# Patient Record
Sex: Female | Born: 1960
Health system: Southern US, Community
[De-identification: ages and names within clinical notes are randomized; demographics above are authoritative.]

## PROBLEM LIST (undated history)

## (undated) DIAGNOSIS — F419 Anxiety disorder, unspecified: Secondary | ICD-10-CM

## (undated) DIAGNOSIS — I8390 Asymptomatic varicose veins of unspecified lower extremity: Secondary | ICD-10-CM

## (undated) DIAGNOSIS — I10 Essential (primary) hypertension: Secondary | ICD-10-CM

## (undated) HISTORY — PX: TUBAL LIGATION: SHX77

## (undated) HISTORY — DX: Asymptomatic varicose veins of unspecified lower extremity: I83.90

## (undated) HISTORY — DX: Anxiety disorder, unspecified: F41.9

## (undated) HISTORY — DX: Essential (primary) hypertension: I10

---

## 2003-01-20 ENCOUNTER — Ambulatory Visit (HOSPITAL_COMMUNITY): Admission: RE | Admit: 2003-01-20 | Discharge: 2003-01-20 | Payer: Self-pay | Admitting: Family Medicine

## 2004-02-23 ENCOUNTER — Ambulatory Visit (HOSPITAL_COMMUNITY): Admission: RE | Admit: 2004-02-23 | Discharge: 2004-02-23 | Payer: Self-pay | Admitting: Family Medicine

## 2005-05-02 ENCOUNTER — Encounter: Admission: RE | Admit: 2005-05-02 | Discharge: 2005-05-02 | Payer: Self-pay | Admitting: Family Medicine

## 2006-08-06 ENCOUNTER — Encounter: Admission: RE | Admit: 2006-08-06 | Discharge: 2006-08-06 | Payer: Self-pay | Admitting: Family Medicine

## 2007-09-23 ENCOUNTER — Ambulatory Visit (HOSPITAL_COMMUNITY): Admission: RE | Admit: 2007-09-23 | Discharge: 2007-09-23 | Payer: Self-pay | Admitting: Neurology

## 2009-12-05 ENCOUNTER — Ambulatory Visit (HOSPITAL_COMMUNITY): Admission: RE | Admit: 2009-12-05 | Discharge: 2009-12-05 | Payer: Self-pay | Admitting: Neurology

## 2010-04-15 ENCOUNTER — Encounter: Payer: Self-pay | Admitting: Family Medicine

## 2011-12-12 ENCOUNTER — Other Ambulatory Visit (HOSPITAL_BASED_OUTPATIENT_CLINIC_OR_DEPARTMENT_OTHER): Payer: Self-pay | Admitting: Family Medicine

## 2011-12-12 DIAGNOSIS — M545 Low back pain, unspecified: Secondary | ICD-10-CM

## 2011-12-17 ENCOUNTER — Ambulatory Visit (HOSPITAL_BASED_OUTPATIENT_CLINIC_OR_DEPARTMENT_OTHER)
Admission: RE | Admit: 2011-12-17 | Discharge: 2011-12-17 | Disposition: A | Discharge status: Home / Self Care | Payer: BC Managed Care – PPO | Source: Ambulatory Visit | Attending: Family Medicine | Admitting: Family Medicine

## 2011-12-17 DIAGNOSIS — M5126 Other intervertebral disc displacement, lumbar region: Secondary | ICD-10-CM | POA: Insufficient documentation

## 2011-12-17 DIAGNOSIS — M545 Low back pain, unspecified: Secondary | ICD-10-CM | POA: Insufficient documentation

## 2011-12-17 DIAGNOSIS — R209 Unspecified disturbances of skin sensation: Secondary | ICD-10-CM | POA: Insufficient documentation

## 2012-07-02 ENCOUNTER — Other Ambulatory Visit (HOSPITAL_COMMUNITY): Payer: Self-pay | Admitting: Neurology

## 2012-07-02 DIAGNOSIS — Z1231 Encounter for screening mammogram for malignant neoplasm of breast: Secondary | ICD-10-CM

## 2012-07-14 ENCOUNTER — Ambulatory Visit (HOSPITAL_COMMUNITY)
Admission: RE | Admit: 2012-07-14 | Discharge: 2012-07-14 | Disposition: A | Discharge status: Home / Self Care | Payer: BC Managed Care – PPO | Source: Ambulatory Visit | Attending: Neurology | Admitting: Neurology

## 2012-07-14 DIAGNOSIS — Z1231 Encounter for screening mammogram for malignant neoplasm of breast: Secondary | ICD-10-CM | POA: Insufficient documentation

## 2012-07-15 ENCOUNTER — Other Ambulatory Visit: Payer: Self-pay | Admitting: Neurology

## 2012-07-15 DIAGNOSIS — R928 Other abnormal and inconclusive findings on diagnostic imaging of breast: Secondary | ICD-10-CM

## 2012-07-16 ENCOUNTER — Ambulatory Visit
Admission: RE | Admit: 2012-07-16 | Discharge: 2012-07-16 | Disposition: A | Discharge status: Home / Self Care | Payer: BC Managed Care – PPO | Source: Ambulatory Visit | Attending: Neurology | Admitting: Neurology

## 2012-07-16 DIAGNOSIS — R928 Other abnormal and inconclusive findings on diagnostic imaging of breast: Secondary | ICD-10-CM

## 2012-07-20 ENCOUNTER — Other Ambulatory Visit: Payer: Self-pay | Admitting: *Deleted

## 2012-07-20 DIAGNOSIS — I83893 Varicose veins of bilateral lower extremities with other complications: Secondary | ICD-10-CM

## 2012-08-07 ENCOUNTER — Encounter: Payer: Self-pay | Admitting: Surgery

## 2012-08-10 ENCOUNTER — Encounter: Payer: BC Managed Care – PPO | Admitting: Vascular Surgery

## 2012-09-28 ENCOUNTER — Encounter: Payer: BC Managed Care – PPO | Admitting: Vascular Surgery

## 2012-10-02 ENCOUNTER — Encounter: Payer: BC Managed Care – PPO | Admitting: Vascular Surgery

## 2012-10-05 ENCOUNTER — Encounter: Payer: BC Managed Care – PPO | Admitting: Vascular Surgery

## 2013-04-14 ENCOUNTER — Other Ambulatory Visit: Payer: Self-pay | Admitting: *Deleted

## 2013-04-14 ENCOUNTER — Encounter: Payer: Self-pay | Admitting: Surgery

## 2013-04-14 DIAGNOSIS — I83893 Varicose veins of bilateral lower extremities with other complications: Secondary | ICD-10-CM

## 2013-06-02 ENCOUNTER — Encounter: Payer: Self-pay | Admitting: Podiatry

## 2013-06-02 ENCOUNTER — Ambulatory Visit (INDEPENDENT_AMBULATORY_CARE_PROVIDER_SITE_OTHER): Payer: BC Managed Care – PPO | Admitting: Podiatry

## 2013-06-02 ENCOUNTER — Ambulatory Visit (INDEPENDENT_AMBULATORY_CARE_PROVIDER_SITE_OTHER): Payer: BC Managed Care – PPO

## 2013-06-02 VITALS — BP 116/68 | HR 74 | Resp 12

## 2013-06-02 DIAGNOSIS — M2010 Hallux valgus (acquired), unspecified foot: Secondary | ICD-10-CM

## 2013-06-02 DIAGNOSIS — R52 Pain, unspecified: Secondary | ICD-10-CM

## 2013-06-02 DIAGNOSIS — M779 Enthesopathy, unspecified: Secondary | ICD-10-CM

## 2013-06-02 NOTE — Progress Notes (Signed)
   Subjective:    Patient ID: Taylor ShoalsLisa A Ourada, female    DOB: March 13, 1961, 53 y.o.   MRN: [id number]  HPI PT STATED BOTH FEET BEEN HURTING 20 YEARS. THE END OF THE DAY THE FEET ARE WORSE, ESPECIALLY WHEN WALKING. TRIED TO WEAR OTC INSERTS AND SEE BIG DIFFERENCE.    Review of Systems  Constitutional: Positive for fatigue.  Cardiovascular: Positive for leg swelling.  Musculoskeletal: Positive for back pain and joint swelling.  Neurological: Positive for numbness.  Hematological: Bruises/bleeds easily.       Objective:   Physical Exam        Assessment & Plan:

## 2013-06-03 NOTE — Progress Notes (Signed)
Subjective:     Patient ID: Taylor Castillo, female   DOB: 1960-11-08, 53 y.o.   MRN: [id number]  HPI patient presents with severe pain in both feet stating she's been on cement floors and that are arches heels and forefoot get tender on both feet   Review of Systems  All other systems reviewed and are negative.       Objective:   Physical Exam  Nursing note and vitals reviewed. Constitutional: She is oriented to person, place, and time.  Cardiovascular: Intact distal pulses.   Musculoskeletal: Normal range of motion.  Neurological: She is oriented to person, place, and time.  Skin: Skin is warm.   neurovascular status is intact with discomfort in the heels and arches of both feet of a moderate nature. Mild structural bunion deformity noted both feet and I noted range of motion and muscle strength to be adequate. Orthopedic shows that the joint motion is normal and the arch height is within normal limits     Assessment:     Chronic fasciitis and foot pain secondary to foot structure and moderate HAV deformity of both feet    Plan:     Reviewed both conditions and x-rays. Scanned for custom orthotics to reduce stress on feet and advised on wider type of steel toe shoes. Reappoint 2 weeks

## 2013-06-17 ENCOUNTER — Ambulatory Visit (INDEPENDENT_AMBULATORY_CARE_PROVIDER_SITE_OTHER): Payer: BC Managed Care – PPO | Admitting: Podiatry

## 2013-06-17 DIAGNOSIS — M722 Plantar fascial fibromatosis: Secondary | ICD-10-CM

## 2013-06-18 NOTE — Progress Notes (Signed)
Subjective:     Patient ID: Taylor ShoalsLisa A Castillo, female   DOB: March 03, 1961, 53 y.o.   MRN: [id number]  HPI patient presents stating I am still having discomfort but hopeful for the orthotics to be of benefit   Review of Systems     Objective:   Physical Exam Neurovascular status unchanged with discomfort still noted upon deep palpation    Assessment:     Continue plantar fasciitis tendinitis-like condition    Plan:     Dispensed orthotics with instructions on usage and reviewed physical therapy and shoe gear modification

## 2013-07-16 ENCOUNTER — Encounter: Payer: Self-pay | Admitting: Surgery

## 2013-07-19 ENCOUNTER — Encounter: Payer: Self-pay | Admitting: Surgery

## 2013-07-19 ENCOUNTER — Ambulatory Visit (INDEPENDENT_AMBULATORY_CARE_PROVIDER_SITE_OTHER): Payer: BC Managed Care – PPO | Admitting: Surgery

## 2013-07-19 ENCOUNTER — Ambulatory Visit (HOSPITAL_COMMUNITY)
Admission: RE | Admit: 2013-07-19 | Discharge: 2013-07-19 | Disposition: A | Discharge status: Home / Self Care | Payer: BC Managed Care – PPO | Source: Ambulatory Visit | Attending: Surgery | Admitting: Surgery

## 2013-07-19 VITALS — BP 121/77 | HR 65 | Ht 67.0 in | Wt 176.0 lb

## 2013-07-19 DIAGNOSIS — I83893 Varicose veins of bilateral lower extremities with other complications: Secondary | ICD-10-CM

## 2013-07-19 NOTE — Progress Notes (Signed)
Patient name: Taylor ShoalsLisa A Connelly MRN: [id number] DOB: Jan 03, 1961 Sex: female   Referred by: Dr. Foy GuadalajaraFried  Reason for referral:  Chief Complaint  Patient presents with  . Varicose Veins    painful bilateral vv's L>R    HISTORY OF PRESENT ILLNESS: This is a 53 year old female who is referred today for evaluation of lower extremity varicose veins.  The patient states that she has been having symptoms in her left leg for many many years.  These have gotten progressively worse.  They are now interfering with her work.  She describes burning and pain around her varicosities.  She also complains of leg swelling which is worse at the end of the day.  She has tried to wear compression in the past but has had to stop secondary to numbness.  She has not had any bleeding episodes.  She denies a history of DVT or PE.  The patient is medically managed for hypertension and anxiety.  She is a nonsmoker.  Past Medical History  Diagnosis Date  . Anxiety   . Hypertension     Past Surgical History  Procedure Laterality Date  . Tubal ligation Bilateral     History   Social History  . Marital Status: Married    Spouse Name: N/A    Number of Children: N/A  . Years of Education: N/A   Occupational History  . Not on file.   Social History Main Topics  . Smoking status: Former Smoker    Quit date: 07/19/2005  . Smokeless tobacco: Not on file  . Alcohol Use: 4.8 oz/week    8 Cans of beer per week  . Drug Use: No  . Sexual Activity: Not on file   Other Topics Concern  . Not on file   Social History Narrative  . No narrative on file    Family History  Problem Relation Age of Onset  . Hypertension Mother   . Asthma Mother   . Hypertension Father   . Asthma Brother   . Alcohol abuse Paternal Grandmother   . Heart disease Paternal Grandmother   . Alcohol abuse Paternal Grandfather     Allergies as of 07/19/2013  . (No Known Allergies)    Current Outpatient Prescriptions on File  Prior to Visit  Medication Sig Dispense Refill  . ALPRAZolam (XANAX) 1 MG tablet Take 1 mg by mouth 2 (two) times daily as needed for anxiety.      . hydrochlorothiazide (MICROZIDE) 12.5 MG capsule Take 12.5 mg by mouth daily.       No current facility-administered medications on file prior to visit.     REVIEW OF SYSTEMS: Cardiovascular: No chest pain, chest pressure, palpitations, orthopnea, or dyspnea on exertion. No claudication or rest pain,  positive for varicosities with pain and swelling Pulmonary: No productive cough, asthma or wheezing. Neurologic: No weakness, paresthesias, aphasia, or amaurosis. No dizziness. Hematologic: No bleeding problems or clotting disorders. Musculoskeletal: No joint pain or joint swelling. Gastrointestinal: No blood in stool or hematemesis Genitourinary: No dysuria or hematuria. Psychiatric:: No history of major depression. Integumentary: No rashes or ulcers. Constitutional: No fever or chills.  PHYSICAL EXAMINATION: General: The patient appears their stated age.  Vital signs are BP 121/77  Pulse 65  Ht 5\' 7"  (1.702 m)  Wt 176 lb (79.833 kg)  BMI 27.56 kg/m2  SpO2 100% HEENT:  No gross abnormalities Pulmonary: Respirations are non-labored Abdomen: Soft and non-tender  Musculoskeletal: There are no major deformities.  Neurologic: No focal weakness or paresthesias are detected, Skin: There are no ulcer or rashes noted. Psychiatric: The patient has normal affect. Cardiovascular: There is a regular rate and rhythm without significant murmur appreciated.  Palpable pedal pulses bilaterally.  Prominent varicosities on the posterior left calf extending up into the medial lower thigh.  1+ lower extremity edema on the left  Diagnostic Studies: I have reviewed her ultrasound study.  This shows no evidence of reflux in the right leg.  On the left there is deep vein reflux at the saphenofemoral junction.  There is no evidence of DVT.  There is reflux  within the left great saphenous vein with maximum diameter of 0.98 at the knee.  At the saphenofemoral junction, the diameter measured 0.9 cm.   Assessment:  Symptomatic venous insufficiency, left leg Plan: The patient is having significant lifestyle limitation from her venous insufficiency on the left leg.  She has very prominent varicosities which are very painful and tender.  She also has swelling and pain in her leg, particularly as the day progresses.  Ultrasound today shows deep system reflux as well as significant reflux within the great saphenous vein.  I have recommended a trial of her thigh high, 20-30 mmHg compression stockings.  The patient will return for further evaluation in 3 months.  She will be a good candidate for laser ablation of the left great saphenous vein as well as the stab phlebectomy of her varicosities.     Jorge NyV. Wells Miking Usrey IV, M.D. [facility] Office: [phone] Pager:  [phone]

## 2013-10-26 ENCOUNTER — Ambulatory Visit: Payer: BC Managed Care – PPO | Admitting: Vascular Surgery

## 2013-11-01 ENCOUNTER — Encounter: Payer: Self-pay | Admitting: Vascular Surgery

## 2013-11-02 ENCOUNTER — Ambulatory Visit: Payer: BC Managed Care – PPO | Admitting: Vascular Surgery

## 2013-11-03 ENCOUNTER — Ambulatory Visit
Admission: RE | Admit: 2013-11-03 | Discharge: 2013-11-03 | Disposition: A | Discharge status: Home / Self Care | Payer: BC Managed Care – PPO | Source: Ambulatory Visit | Attending: Cardiology | Admitting: Cardiology

## 2013-11-03 ENCOUNTER — Other Ambulatory Visit: Payer: Self-pay | Admitting: Cardiology

## 2013-11-03 DIAGNOSIS — R0789 Other chest pain: Secondary | ICD-10-CM

## 2013-11-12 ENCOUNTER — Encounter: Payer: Self-pay | Admitting: Vascular Surgery

## 2013-11-15 ENCOUNTER — Ambulatory Visit: Payer: BC Managed Care – PPO | Admitting: Vascular Surgery

## 2014-02-07 ENCOUNTER — Encounter: Payer: Self-pay | Admitting: Vascular Surgery

## 2014-02-08 ENCOUNTER — Ambulatory Visit (INDEPENDENT_AMBULATORY_CARE_PROVIDER_SITE_OTHER): Payer: BC Managed Care – PPO | Admitting: Vascular Surgery

## 2014-02-08 ENCOUNTER — Encounter: Payer: Self-pay | Admitting: Vascular Surgery

## 2014-02-08 VITALS — BP 129/82 | HR 76 | Resp 16 | Ht 68.0 in | Wt 178.0 lb

## 2014-02-08 DIAGNOSIS — I83892 Varicose veins of left lower extremities with other complications: Secondary | ICD-10-CM

## 2014-02-08 NOTE — Progress Notes (Signed)
Subjective:     Patient ID: Taylor Castillo, female   DOB: 02/07/1961, 53 y.o.   MRN: [id number]  HPI this 53 year old female returns for continued follow-up regarding her painful varicosities in the left leg. She works standing on her feet most of the day had [employer] [place]. She has pain which includes aching throbbing and burning discomfort as well as progressive swelling in the left ankle. This has not been relieved by long-leg elastic compression stockings 20-30 mm gradient or elevation and ibuprofen. This is affecting her daily living and ability to work.   Past Medical History  Diagnosis Date  . Anxiety   . Hypertension   . Varicose veins     History  Substance Use Topics  . Smoking status: Former Smoker    Quit date: 07/19/2005  . Smokeless tobacco: Not on file  . Alcohol Use: 4.8 oz/week    8 Cans of beer per week    Family History  Problem Relation Age of Onset  . Hypertension Mother   . Asthma Mother   . Hypertension Father   . Asthma Brother   . Alcohol abuse Paternal Grandmother   . Heart disease Paternal Grandmother   . Alcohol abuse Paternal Grandfather     No Known Allergies  Current outpatient prescriptions: ALPRAZolam (XANAX) 1 MG tablet, Take 1 mg by mouth 2 (two) times daily as needed for anxiety., Disp: , Rfl: ;  hydrochlorothiazide (MICROZIDE) 12.5 MG capsule, Take 12.5 mg by mouth daily., Disp: , Rfl:   BP 129/82 mmHg  Pulse 76  Resp 16  Ht 5\' 8"  (1.727 m)  Wt 178 lb (80.74 kg)  BMI 27.07 kg/m2  Body mass index is 27.07 kg/(m^2).          Review of Systems Denies chest pain, dyspnea on exertion, PND, orthopnea, hemoptysis, claudication.     Objective:   Physical Exam BP 129/82 mmHg  Pulse 76  Resp 16  Ht 5\' 8"  (1.727 m)  Wt 178 lb (80.74 kg)  BMI 27.07 kg/m2  .Gen. Well-developed well-nourished female in no apparent stress alert and oriented 3 Lungs no rhonchi or wheezing Left leg with large bulging  varicosities beginning in the medial thigh mid thigh area and extending medial to the knee and into the medial calf and posteriorly into the popliteal fossa. 1+ edema present. No hyperpigmentation or ulceration noted.  Patient has documented gross reflux throughout left great saphenous vein supplying these bulging varicosities with no DVT     Assessment:     Painful varicosities left leg due to gross reflux throughout left great saphenous vein. Symptoms are resistant to conservative measures and affecting patient's daily living and ability to work     Plan:     Patient needs laser ablation left great saphenous vein with greater than 20 stab phlebectomy of painful varicosities. Will proceed with pre-certification to perform this and hopefully relieve her symptoms in the near future

## 2014-03-01 ENCOUNTER — Other Ambulatory Visit: Payer: Self-pay | Admitting: *Deleted

## 2014-03-01 DIAGNOSIS — I83892 Varicose veins of left lower extremities with other complications: Secondary | ICD-10-CM

## 2014-03-11 ENCOUNTER — Encounter: Payer: Self-pay | Admitting: Vascular Surgery

## 2014-03-14 ENCOUNTER — Ambulatory Visit (INDEPENDENT_AMBULATORY_CARE_PROVIDER_SITE_OTHER): Payer: BC Managed Care – PPO | Admitting: Vascular Surgery

## 2014-03-14 ENCOUNTER — Encounter: Payer: Self-pay | Admitting: Vascular Surgery

## 2014-03-14 VITALS — BP 133/82 | HR 76 | Resp 16 | Ht 67.0 in | Wt 170.0 lb

## 2014-03-14 DIAGNOSIS — I83892 Varicose veins of left lower extremities with other complications: Secondary | ICD-10-CM

## 2014-03-14 NOTE — Progress Notes (Addendum)
     Laser Ablation Procedure    Date: 03/14/2014   Taylor ShoalsLisa A Hojnacki DOB:21-Aug-1960  Consent signed: Yes    Surgeon:  Dr. Hart RochesterLawson  Procedure: Laser Ablation: left Greater Saphenous Vein Start time: 9am, stop time: 10:30  BP 133/82 mmHg  Pulse 76  Resp 16  Ht 5\' 7"  (1.702 m)  Wt 170 lb (77.111 kg)  BMI 26.62 kg/m2  Tumescent Anesthesia: 500 cc 0.9% NaCl with 50 cc Lidocaine HCL with 1% Epi and 15 cc 8.4% NaHCO3  Local Anesthesia: 9 cc Lidocaine HCL and NaHCO3 (ratio 2:1)  15 watts pulsed  Mode, 500 ms delay, 1.0 duration Total energy: 1466, total pulses: 98, total time: 1:37        T    Stab Phlebectomy: >20 Sites: Thigh and Calf  Patient tolerated procedure well  Notes:   Description of Procedure:  After marking the course of the secondary varicosities, the patient was placed on the operating table in the supine position, and the left leg was prepped and draped in sterile fashion.   Local anesthetic was administered and under ultrasound guidance the saphenous vein was accessed with a micro needle and guide wire; then the mirco puncture sheath was placed.  A guide wire was inserted saphenofemoral junction , followed by a 5 french sheath.  The position of the sheath and then the laser fiber below the junction was confirmed using the ultrasound.  Tumescent anesthesia was administered along the course of the saphenous vein using ultrasound guidance. The patient was placed in Trendelenburg position and protective laser glasses were placed on patient and staff, and the laser was fired at 15 watts continuous mode advancing 1-842mm/second for a total of 1466 joules.   For stab phlebectomies, local anesthetic was administered at the previously marked varicosities, and tumescent anesthesia was administered around the vessels.  Greater than 20 stab wounds were made using the tip of an 11 blade. And using the vein hook, the phlebectomies were performed using a hemostat to avulse the  varicosities.  Adequate hemostasis was achieved.     Steri strips were applied to the stab wounds and ABD pads and thigh high compression stockings were applied.  Ace wrap bandages were applied over the phlebectomy sites and at the top of the saphenofemoral junction. Blood loss was less than 15 cc.  The patient ambulated out of the operating room having tolerated the procedure well.

## 2014-03-14 NOTE — Progress Notes (Signed)
Subjective:     Patient ID: Taylor ShoalsLisa A Castillo, female   DOB: 1960/06/09, 53 y.o.   MRN: [id number]  HPI this 53 year old female had laser ablation of the left great saphenous vein plus approximately 30 stab phlebectomy of painful varicosities performed or local tumescent anesthesia. A total of 1466 J of energy was utilized. She tolerated the procedure well. Review of Systems     Objective:   Physical Exam BP 133/82 mmHg  Pulse 76  Resp 16  Ht 5\' 7"  (1.702 m)  Wt 170 lb (77.111 kg)  BMI 26.62 kg/m2       Assessment:     Well-tolerated laser ablation left great saphenous vein plus greater than 20 stab phlebectomy of painful varicosities performed under local tumescent anesthesia    Plan:     Return in one week for venous duplex exam to confirm closure left great saphenous vein Patient will then return later for sclerotherapy

## 2014-03-15 ENCOUNTER — Telehealth: Payer: Self-pay | Admitting: *Deleted

## 2014-03-15 NOTE — Telephone Encounter (Signed)
Pt doing well. No problems. No bleeding. Following all instructions. 

## 2014-03-17 ENCOUNTER — Encounter: Payer: Self-pay | Admitting: Vascular Surgery

## 2014-03-21 ENCOUNTER — Encounter (HOSPITAL_COMMUNITY): Payer: BC Managed Care – PPO

## 2014-03-21 ENCOUNTER — Ambulatory Visit (HOSPITAL_COMMUNITY)
Admission: RE | Admit: 2014-03-21 | Discharge: 2014-03-21 | Disposition: A | Discharge status: Home / Self Care | Payer: BC Managed Care – PPO | Source: Ambulatory Visit | Attending: Vascular Surgery | Admitting: Vascular Surgery

## 2014-03-21 ENCOUNTER — Ambulatory Visit: Payer: BC Managed Care – PPO | Admitting: Vascular Surgery

## 2014-03-21 ENCOUNTER — Ambulatory Visit (INDEPENDENT_AMBULATORY_CARE_PROVIDER_SITE_OTHER): Payer: BC Managed Care – PPO | Admitting: Vascular Surgery

## 2014-03-21 ENCOUNTER — Encounter: Payer: Self-pay | Admitting: Vascular Surgery

## 2014-03-21 VITALS — BP 140/81 | HR 67 | Resp 16 | Ht 67.0 in | Wt 165.0 lb

## 2014-03-21 DIAGNOSIS — I83892 Varicose veins of left lower extremities with other complications: Secondary | ICD-10-CM | POA: Diagnosis not present

## 2014-03-21 NOTE — Progress Notes (Signed)
Subjective:     Patient ID: Taylor Castillo, female   DOB: December 16, 1960, 53 y.o.   MRN: [id number]  HPI this 53 year old female returns 1 week post laser ablation left great saphenous vein with multiple stab phlebectomy of painful varicosities. She had a significant amount of discomfort in the left medial thigh over the great saphenous vein particularly distally in the thigh. She change to a different type of elastic compression using bilateral spanks type stockings which improved her situation. Pain has now resolved. She had no pain in the stab phlebectomy sites and denies any distal edema. She has had no chest pain, dyspnea on exertion, PND, hemoptysis, or other symptoms. Review of Systems     Objective:   Physical Exam BP 140/81 mmHg  Pulse 67  Resp 16  Ht 5\' 7"  (1.702 m)  Wt 165 lb (74.844 kg)  BMI 25.84 kg/m2  Gen. well-developed well-nourished female in no apparent distress alert and oriented 3 Left leg with moderate ecchymosis medial thigh and mild tenderness to palpation over great saphenous vein. 3+ dorsalis pedis pulse palpable.  Today I ordered a venous duplex exam of the left leg which I reviewed and interpreted. There is no DVT. There is total closure of the proximal half of the saphenous vein up to about 1 cm from the saphenofemoral junction with partial occlusion of the great saphenous vein in the distal thigh.     Assessment:     Successful laser ablation left great saphenous vein with multiple stab phlebectomy for painful varicosities    Plan:     Patient will return in the near future for sclerotherapy to complete her treatment regimen

## 2014-05-02 ENCOUNTER — Encounter: Payer: Self-pay | Admitting: *Deleted

## 2014-05-02 ENCOUNTER — Encounter: Payer: Self-pay | Admitting: Vascular Surgery

## 2014-05-04 ENCOUNTER — Ambulatory Visit: Payer: Self-pay | Admitting: *Deleted

## 2014-09-13 ENCOUNTER — Other Ambulatory Visit (HOSPITAL_COMMUNITY): Payer: Self-pay | Admitting: Neurology

## 2014-09-13 ENCOUNTER — Other Ambulatory Visit: Payer: Self-pay

## 2014-09-13 DIAGNOSIS — Z1231 Encounter for screening mammogram for malignant neoplasm of breast: Secondary | ICD-10-CM

## 2014-09-20 ENCOUNTER — Ambulatory Visit (HOSPITAL_COMMUNITY)
Admission: RE | Admit: 2014-09-20 | Discharge: 2014-09-20 | Disposition: A | Discharge status: Home / Self Care | Payer: BLUE CROSS/BLUE SHIELD | Source: Ambulatory Visit | Attending: Neurology | Admitting: Neurology

## 2014-09-20 DIAGNOSIS — Z1231 Encounter for screening mammogram for malignant neoplasm of breast: Secondary | ICD-10-CM | POA: Diagnosis not present

## 2016-06-17 DIAGNOSIS — Z1211 Encounter for screening for malignant neoplasm of colon: Secondary | ICD-10-CM | POA: Diagnosis not present

## 2016-06-17 DIAGNOSIS — D123 Benign neoplasm of transverse colon: Secondary | ICD-10-CM | POA: Diagnosis not present

## 2016-08-13 DIAGNOSIS — M7752 Other enthesopathy of left foot: Secondary | ICD-10-CM | POA: Diagnosis not present

## 2016-08-13 DIAGNOSIS — M722 Plantar fascial fibromatosis: Secondary | ICD-10-CM | POA: Diagnosis not present

## 2016-08-13 DIAGNOSIS — M7732 Calcaneal spur, left foot: Secondary | ICD-10-CM | POA: Diagnosis not present

## 2016-10-30 DIAGNOSIS — F419 Anxiety disorder, unspecified: Secondary | ICD-10-CM | POA: Diagnosis not present

## 2016-10-30 DIAGNOSIS — I10 Essential (primary) hypertension: Secondary | ICD-10-CM | POA: Diagnosis not present

## 2016-12-31 DIAGNOSIS — M5432 Sciatica, left side: Secondary | ICD-10-CM | POA: Diagnosis not present

## 2017-01-01 ENCOUNTER — Other Ambulatory Visit: Payer: Self-pay | Admitting: Internal Medicine

## 2017-01-01 DIAGNOSIS — M5432 Sciatica, left side: Secondary | ICD-10-CM

## 2017-01-29 ENCOUNTER — Other Ambulatory Visit: Payer: Self-pay | Admitting: Internal Medicine

## 2017-01-29 DIAGNOSIS — Z1231 Encounter for screening mammogram for malignant neoplasm of breast: Secondary | ICD-10-CM

## 2017-02-28 ENCOUNTER — Ambulatory Visit
Admission: RE | Admit: 2017-02-28 | Discharge: 2017-02-28 | Disposition: A | Discharge status: Home / Self Care | Payer: BLUE CROSS/BLUE SHIELD | Source: Ambulatory Visit | Attending: Internal Medicine | Admitting: Internal Medicine

## 2017-02-28 DIAGNOSIS — Z1231 Encounter for screening mammogram for malignant neoplasm of breast: Secondary | ICD-10-CM | POA: Diagnosis not present

## 2017-03-20 DIAGNOSIS — H5789 Other specified disorders of eye and adnexa: Secondary | ICD-10-CM | POA: Diagnosis not present

## 2018-03-31 DIAGNOSIS — F419 Anxiety disorder, unspecified: Secondary | ICD-10-CM | POA: Diagnosis not present

## 2018-03-31 DIAGNOSIS — L719 Rosacea, unspecified: Secondary | ICD-10-CM | POA: Diagnosis not present

## 2018-03-31 DIAGNOSIS — I10 Essential (primary) hypertension: Secondary | ICD-10-CM | POA: Diagnosis not present

## 2018-05-01 ENCOUNTER — Other Ambulatory Visit: Payer: Self-pay | Admitting: Physician Assistant

## 2018-05-01 DIAGNOSIS — Z1231 Encounter for screening mammogram for malignant neoplasm of breast: Secondary | ICD-10-CM

## 2018-05-08 ENCOUNTER — Ambulatory Visit: Payer: BLUE CROSS/BLUE SHIELD

## 2018-05-14 ENCOUNTER — Ambulatory Visit: Payer: BLUE CROSS/BLUE SHIELD

## 2018-05-19 DIAGNOSIS — M79601 Pain in right arm: Secondary | ICD-10-CM | POA: Diagnosis not present

## 2018-05-25 ENCOUNTER — Other Ambulatory Visit (HOSPITAL_BASED_OUTPATIENT_CLINIC_OR_DEPARTMENT_OTHER): Payer: Self-pay | Admitting: Physician Assistant

## 2018-05-25 DIAGNOSIS — M79601 Pain in right arm: Secondary | ICD-10-CM

## 2018-05-25 DIAGNOSIS — Z1231 Encounter for screening mammogram for malignant neoplasm of breast: Secondary | ICD-10-CM

## 2018-05-27 ENCOUNTER — Ambulatory Visit (HOSPITAL_BASED_OUTPATIENT_CLINIC_OR_DEPARTMENT_OTHER)
Admission: RE | Admit: 2018-05-27 | Discharge: 2018-05-27 | Disposition: A | Discharge status: Home / Self Care | Payer: BLUE CROSS/BLUE SHIELD | Source: Ambulatory Visit | Attending: Physician Assistant | Admitting: Physician Assistant

## 2018-05-27 DIAGNOSIS — M79601 Pain in right arm: Secondary | ICD-10-CM | POA: Diagnosis not present

## 2018-05-27 DIAGNOSIS — Z1231 Encounter for screening mammogram for malignant neoplasm of breast: Secondary | ICD-10-CM

## 2018-05-27 DIAGNOSIS — M79621 Pain in right upper arm: Secondary | ICD-10-CM | POA: Diagnosis not present

## 2018-05-28 ENCOUNTER — Other Ambulatory Visit (HOSPITAL_BASED_OUTPATIENT_CLINIC_OR_DEPARTMENT_OTHER): Payer: BLUE CROSS/BLUE SHIELD

## 2018-06-01 ENCOUNTER — Ambulatory Visit: Payer: BLUE CROSS/BLUE SHIELD

## 2018-06-04 DIAGNOSIS — M25511 Pain in right shoulder: Secondary | ICD-10-CM | POA: Diagnosis not present

## 2019-05-06 ENCOUNTER — Other Ambulatory Visit (HOSPITAL_BASED_OUTPATIENT_CLINIC_OR_DEPARTMENT_OTHER): Payer: Self-pay | Admitting: Family Medicine

## 2019-05-06 DIAGNOSIS — Z1231 Encounter for screening mammogram for malignant neoplasm of breast: Secondary | ICD-10-CM

## 2019-05-27 ENCOUNTER — Other Ambulatory Visit: Payer: Self-pay

## 2019-05-27 ENCOUNTER — Ambulatory Visit (HOSPITAL_BASED_OUTPATIENT_CLINIC_OR_DEPARTMENT_OTHER)
Admission: RE | Admit: 2019-05-27 | Discharge: 2019-05-27 | Disposition: A | Discharge status: Home / Self Care | Payer: 59 | Source: Ambulatory Visit | Attending: Family Medicine | Admitting: Family Medicine

## 2019-05-27 DIAGNOSIS — Z1231 Encounter for screening mammogram for malignant neoplasm of breast: Secondary | ICD-10-CM | POA: Insufficient documentation

## 2020-07-20 ENCOUNTER — Other Ambulatory Visit (HOSPITAL_BASED_OUTPATIENT_CLINIC_OR_DEPARTMENT_OTHER): Payer: Self-pay | Admitting: Family Medicine

## 2020-07-20 DIAGNOSIS — Z1231 Encounter for screening mammogram for malignant neoplasm of breast: Secondary | ICD-10-CM

## 2020-07-25 ENCOUNTER — Ambulatory Visit (HOSPITAL_BASED_OUTPATIENT_CLINIC_OR_DEPARTMENT_OTHER)
Admission: RE | Admit: 2020-07-25 | Discharge: 2020-07-25 | Disposition: A | Discharge status: Home / Self Care | Payer: 59 | Source: Ambulatory Visit | Attending: Family Medicine | Admitting: Family Medicine

## 2020-07-25 ENCOUNTER — Encounter (HOSPITAL_BASED_OUTPATIENT_CLINIC_OR_DEPARTMENT_OTHER): Payer: Self-pay

## 2020-07-25 ENCOUNTER — Other Ambulatory Visit: Payer: Self-pay

## 2020-07-25 DIAGNOSIS — Z1231 Encounter for screening mammogram for malignant neoplasm of breast: Secondary | ICD-10-CM | POA: Diagnosis present

## 2020-07-26 ENCOUNTER — Ambulatory Visit (HOSPITAL_BASED_OUTPATIENT_CLINIC_OR_DEPARTMENT_OTHER): Payer: 59

## 2020-07-27 ENCOUNTER — Ambulatory Visit (HOSPITAL_BASED_OUTPATIENT_CLINIC_OR_DEPARTMENT_OTHER): Payer: 59

## 2021-07-13 ENCOUNTER — Ambulatory Visit: Payer: 59 | Admitting: Podiatry

## 2021-07-30 ENCOUNTER — Ambulatory Visit: Payer: 59 | Admitting: Podiatry

## 2021-08-30 ENCOUNTER — Other Ambulatory Visit: Payer: Self-pay | Admitting: Family Medicine

## 2021-08-30 DIAGNOSIS — Z1231 Encounter for screening mammogram for malignant neoplasm of breast: Secondary | ICD-10-CM

## 2021-10-24 ENCOUNTER — Ambulatory Visit
Admission: RE | Admit: 2021-10-24 | Discharge: 2021-10-24 | Disposition: A | Discharge status: Home / Self Care | Payer: 59 | Source: Ambulatory Visit | Attending: Family Medicine | Admitting: Family Medicine

## 2021-10-24 DIAGNOSIS — Z1231 Encounter for screening mammogram for malignant neoplasm of breast: Secondary | ICD-10-CM

## 2021-10-26 ENCOUNTER — Inpatient Hospital Stay: Admission: RE | Admit: 2021-10-26 | Payer: 59 | Source: Ambulatory Visit

## 2022-09-26 IMAGING — MG MM DIGITAL SCREENING BILAT W/ TOMO AND CAD
8 series · 8 of 24 positions shown · non-contrast
Comparison: Previous exam(s).

CLINICAL DATA: Screening.

EXAM:
DIGITAL SCREENING BILATERAL MAMMOGRAM WITH TOMOSYNTHESIS AND CAD
TECHNIQUE: Bilateral screening digital craniocaudal and mediolateral oblique
mammograms were obtained. Bilateral screening digital breast
tomosynthesis was performed. The images were evaluated with
computer-aided detection.

[R MLO synth-2D]
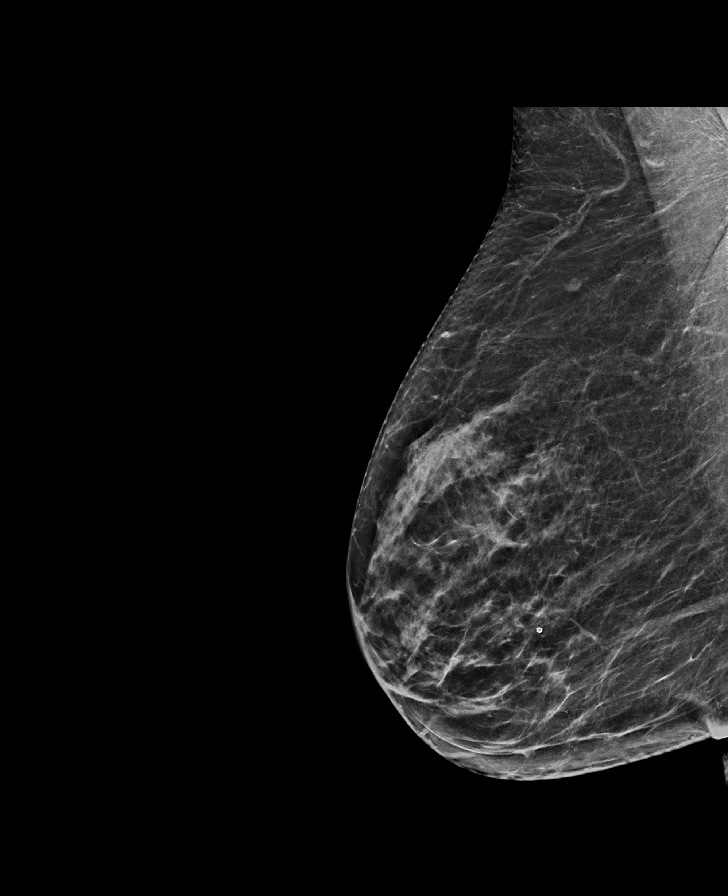

[L CC synth-2D]
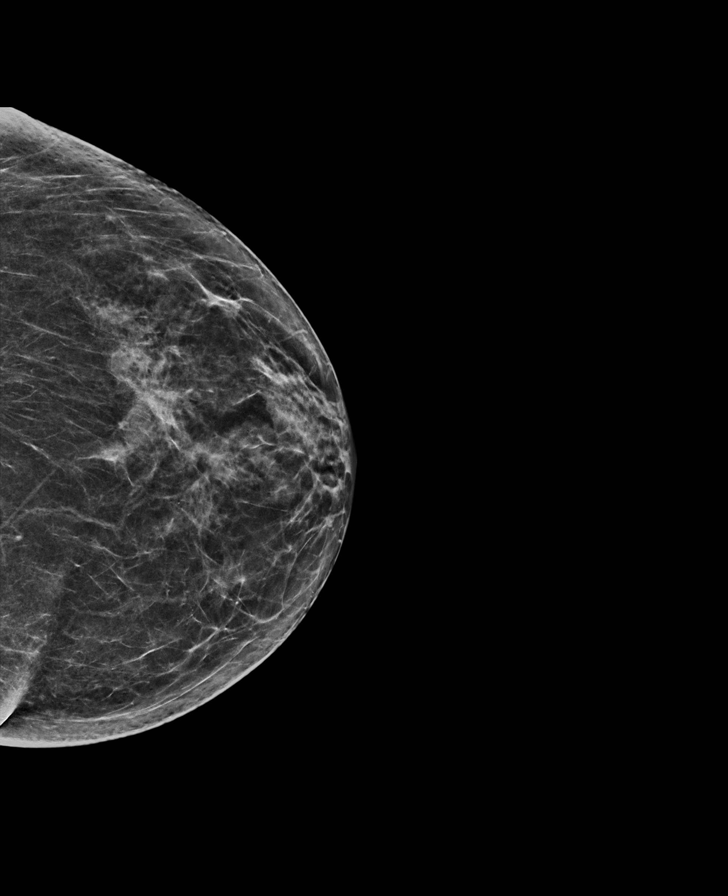

[L MLO synth-2D]
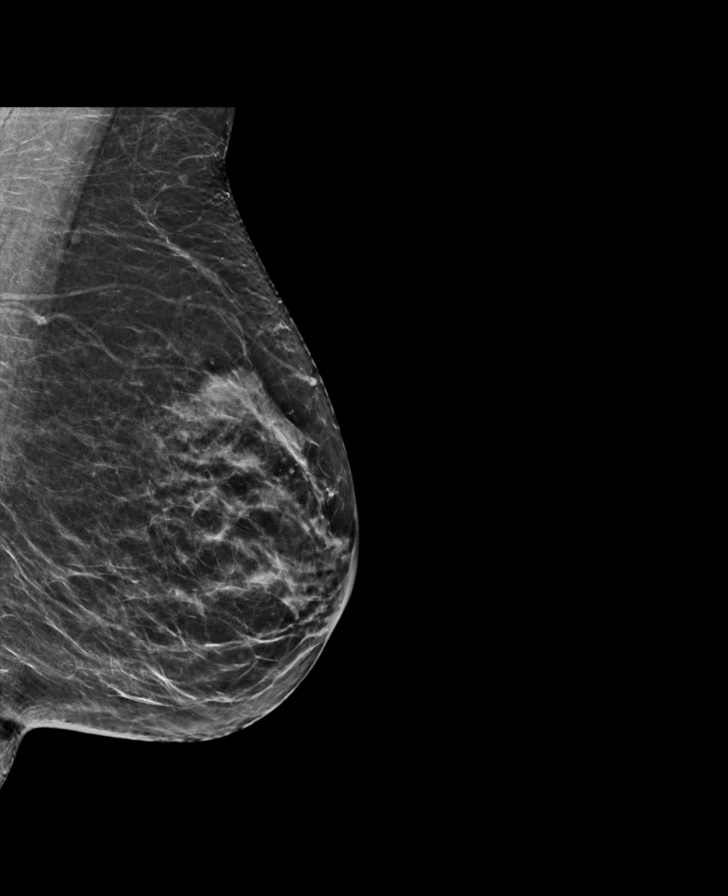

[R CC synth-2D]
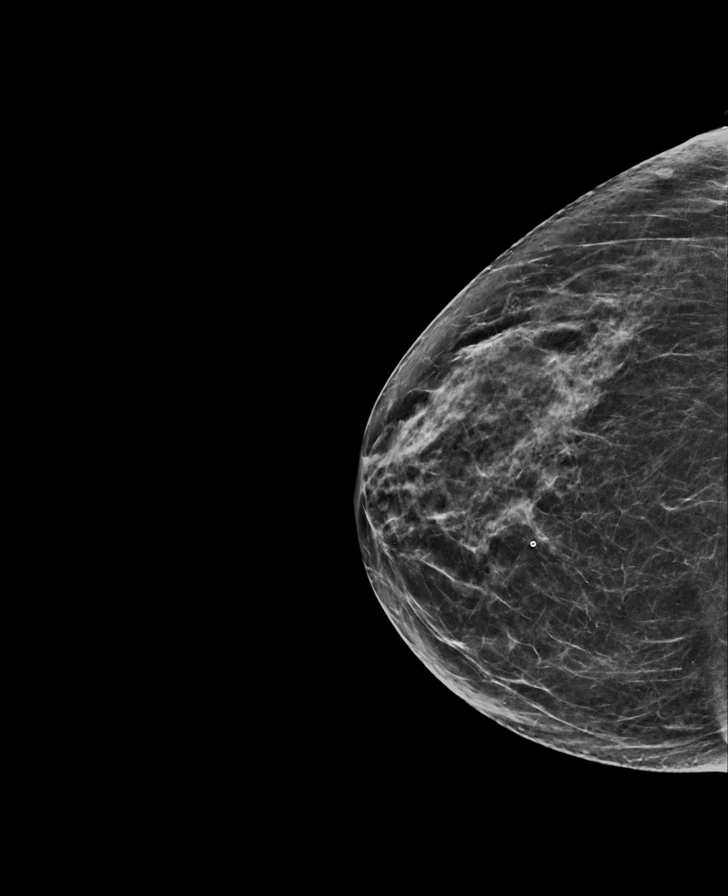

[R CC tomo · tomo slice 32/63.0]
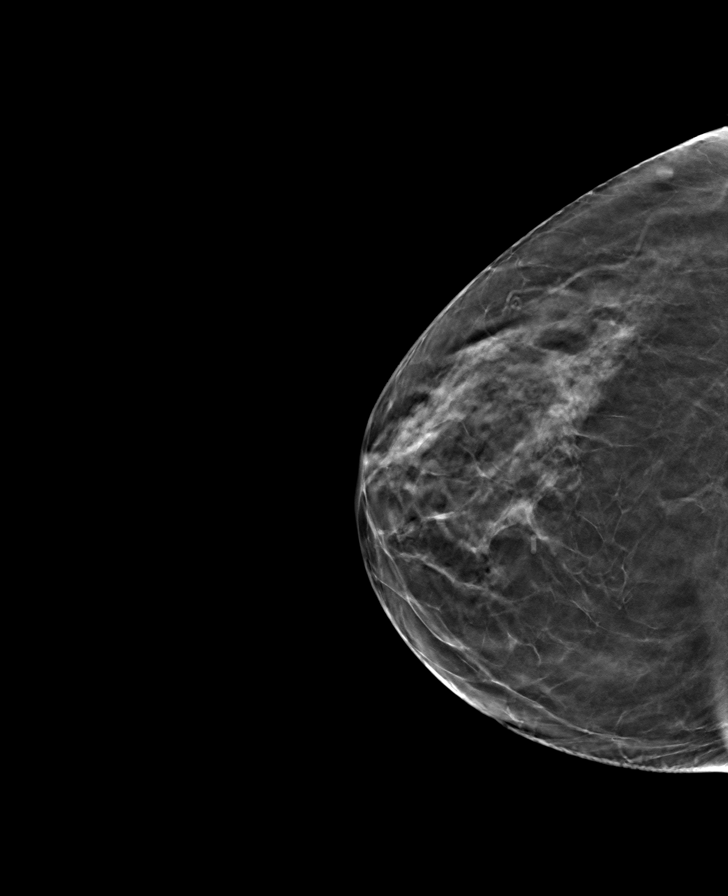

[R MLO tomo · tomo slice 33/66.0]
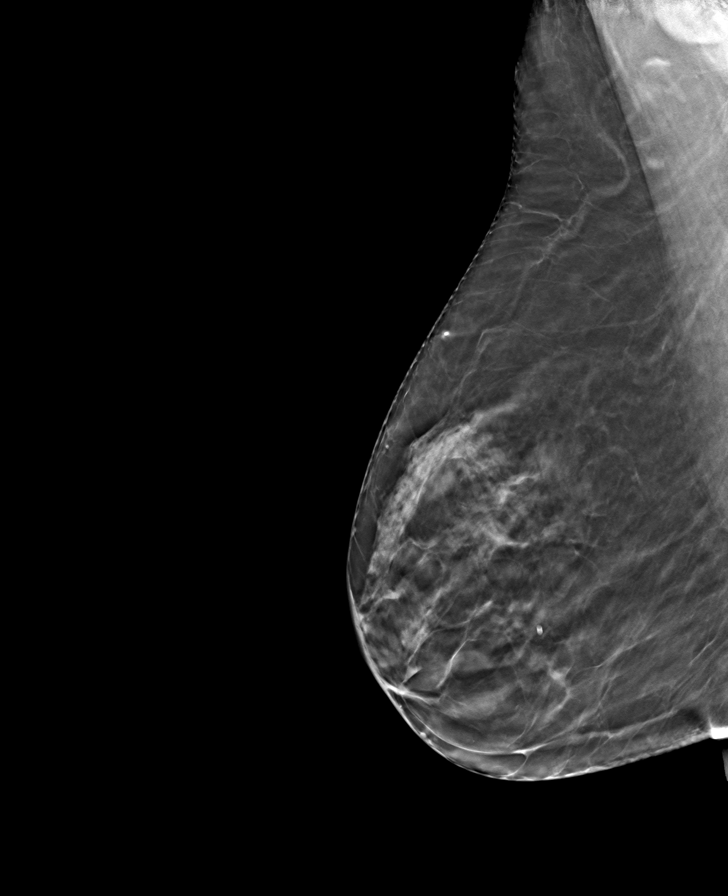

[L MLO tomo · tomo slice 33/66.0]
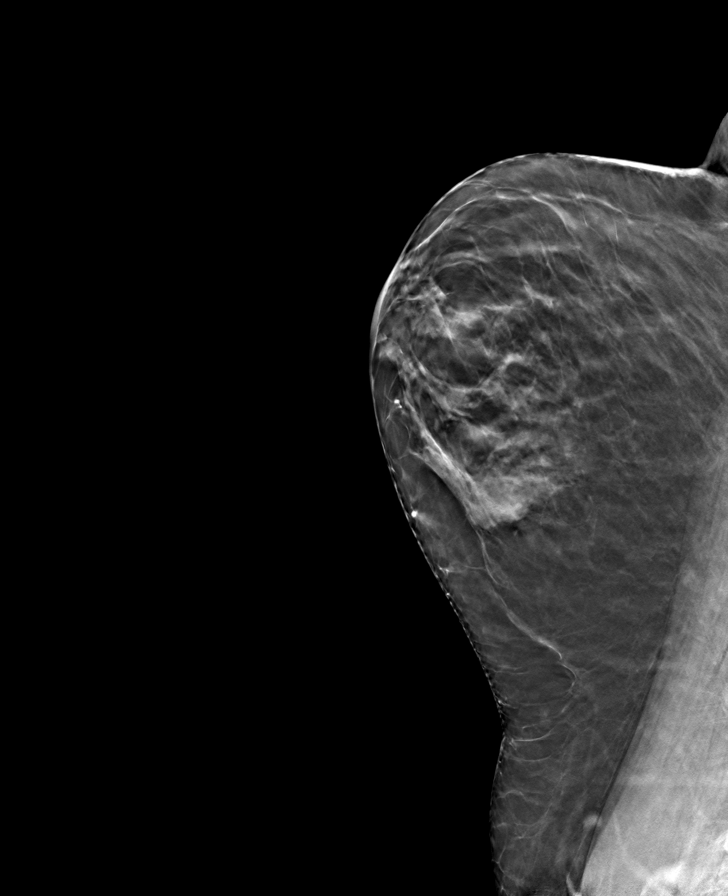

[L CC tomo · tomo slice 32/63.0]
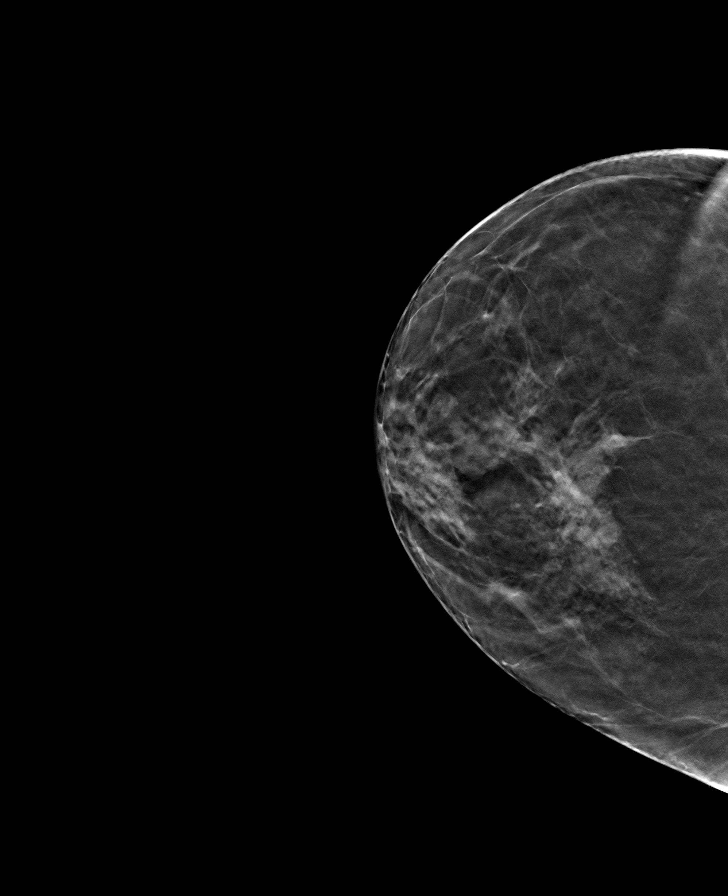

[8 of 24 positions shown; findings below may reference images not displayed]

ACR Breast Density Category c: The breast tissue is heterogeneously
dense, which may obscure small masses.
FINDINGS: There are no findings suspicious for malignancy. The images were
evaluated with computer-aided detection.
IMPRESSION: No mammographic evidence of malignancy. A result letter of this
screening mammogram will be mailed directly to the patient.

RECOMMENDATION:
Screening mammogram in one year. (Code:T4-5-GWO)

BI-RADS CATEGORY  1: Negative.

## 2022-10-03 ENCOUNTER — Other Ambulatory Visit: Payer: Self-pay | Admitting: Family Medicine

## 2022-10-03 DIAGNOSIS — Z1231 Encounter for screening mammogram for malignant neoplasm of breast: Secondary | ICD-10-CM

## 2022-11-13 ENCOUNTER — Inpatient Hospital Stay: Admission: RE | Admit: 2022-11-13 | Payer: 59 | Source: Ambulatory Visit

## 2022-11-19 DIAGNOSIS — M542 Cervicalgia: Secondary | ICD-10-CM | POA: Diagnosis not present

## 2022-11-19 DIAGNOSIS — F419 Anxiety disorder, unspecified: Secondary | ICD-10-CM | POA: Diagnosis not present

## 2022-11-19 DIAGNOSIS — B001 Herpesviral vesicular dermatitis: Secondary | ICD-10-CM | POA: Diagnosis not present

## 2022-11-19 DIAGNOSIS — I10 Essential (primary) hypertension: Secondary | ICD-10-CM | POA: Diagnosis not present

## 2022-12-04 ENCOUNTER — Ambulatory Visit
Admission: RE | Admit: 2022-12-04 | Discharge: 2022-12-04 | Disposition: A | Discharge status: Home / Self Care | Payer: BC Managed Care – PPO | Source: Ambulatory Visit | Attending: Family Medicine | Admitting: Family Medicine

## 2022-12-04 DIAGNOSIS — Z1231 Encounter for screening mammogram for malignant neoplasm of breast: Secondary | ICD-10-CM

## 2023-01-06 DIAGNOSIS — H578A1 Foreign body sensation, right eye: Secondary | ICD-10-CM | POA: Diagnosis not present

## 2023-01-07 DIAGNOSIS — T1511XD Foreign body in conjunctival sac, right eye, subsequent encounter: Secondary | ICD-10-CM | POA: Diagnosis not present

## 2023-02-26 ENCOUNTER — Ambulatory Visit (HOSPITAL_BASED_OUTPATIENT_CLINIC_OR_DEPARTMENT_OTHER)
Admission: EM | Admit: 2023-02-26 | Discharge: 2023-02-26 | Disposition: A | Discharge status: Home / Self Care | Payer: BC Managed Care – PPO | Attending: Internal Medicine | Admitting: Internal Medicine

## 2023-02-26 ENCOUNTER — Encounter (HOSPITAL_BASED_OUTPATIENT_CLINIC_OR_DEPARTMENT_OTHER): Payer: Self-pay

## 2023-02-26 ENCOUNTER — Other Ambulatory Visit (HOSPITAL_BASED_OUTPATIENT_CLINIC_OR_DEPARTMENT_OTHER): Payer: Self-pay

## 2023-02-26 DIAGNOSIS — J180 Bronchopneumonia, unspecified organism: Secondary | ICD-10-CM

## 2023-02-26 MED ORDER — AZITHROMYCIN 250 MG PO TABS
250.0000 mg | ORAL_TABLET | Freq: Every day | ORAL | 0 refills | Status: AC
  Filled 2023-02-26: qty 6, 6d supply, fill #0

## 2023-02-26 MED ORDER — AMOXICILLIN 875 MG PO TABS
875.0000 mg | ORAL_TABLET | Freq: Two times a day (BID) | ORAL | 0 refills | Status: AC
  Filled 2023-02-26: qty 14, 7d supply, fill #0

## 2023-02-26 MED ORDER — PROMETHAZINE-DM 6.25-15 MG/5ML PO SYRP
5.0000 mL | ORAL_SOLUTION | Freq: Four times a day (QID) | ORAL | 0 refills | Status: AC | PRN
  Filled 2023-02-26: qty 118, 6d supply, fill #0

## 2023-02-26 NOTE — ED Provider Notes (Signed)
[facility]    CSN: [id number] Arrival date & time: 02/26/23  0945      History   Chief Complaint Chief Complaint  Patient presents with   Cough   Headache   Sore Throat    HPI Taylor Castillo is a 62 y.o. female.   The history is provided by the patient.  Cough Associated symptoms: chills, ear pain, fever, headaches and sore throat   Associated symptoms: no chest pain, no diaphoresis, no shortness of breath and no wheezing   Headache Associated symptoms: cough, ear pain, fatigue, fever, sinus pressure and sore throat   Associated symptoms: no dizziness   Sore Throat Associated symptoms include headaches. Pertinent negatives include no chest pain and no shortness of breath.  Sick for 2 to 3 weeks symptoms include cough productive of sputum, headache, body aches, fatigue, subjective fever, sore throat.  Symptoms getting worse over the last 2 to 3 days, states her whole body hurts when she coughs. Admits multiple close contacts with illness but no confirmed COVID or flu.  Denies smoking.  Past Medical History:  Diagnosis Date   Anxiety    Hypertension    Varicose veins     Patient Active Problem List   Diagnosis Date Noted   Varicose veins of bilateral lower extremities with other complications 07/19/2013    Past Surgical History:  Procedure Laterality Date   TUBAL LIGATION Bilateral     OB History   No obstetric history on file.      Home Medications    Prior to Admission medications   Medication Sig Start Date End Date Taking? Authorizing Provider  amoxicillin (AMOXIL) 875 MG tablet Take 1 tablet (875 mg total) by mouth 2 (two) times daily for 7 days. 02/26/23 03/05/23 Yes Meliton Rattan, PA  azithromycin (ZITHROMAX) 250 MG tablet Take 1 tablet (250 mg total) by mouth daily. Take first 2 tablets together, then 1 every day until finished. 02/26/23  Yes Meliton Rattan, PA  promethazine-dextromethorphan (PROMETHAZINE-DM) 6.25-15 MG/5ML syrup Take 5  mLs by mouth 4 (four) times daily as needed for up to 6 days for cough. 02/26/23 03/04/23 Yes Nocole Zammit, Marylene Land, PA  ALPRAZolam Prudy Feeler) 1 MG tablet Take 1 mg by mouth 2 (two) times daily as needed for anxiety.    [provider]  hydrochlorothiazide (MICROZIDE) 12.5 MG capsule Take 12.5 mg by mouth daily.    [provider]    Family History Family History  Problem Relation Age of Onset   Asthma Brother    Alcohol abuse Paternal Grandmother    Heart disease Paternal Grandmother    Alcohol abuse Paternal Grandfather    Hypertension Mother    Asthma Mother    Hypertension Father    Breast cancer Neg Hx     Social History Social History   Tobacco Use   Smoking status: Former    Current packs/day: 0.00    Types: Cigarettes    Quit date: 07/19/2005    Years since quitting: 17.6   Smokeless tobacco: Never  Substance Use Topics   Alcohol use: Yes    Alcohol/week: 8.0 standard drinks of alcohol    Types: 8 Cans of beer per week   Drug use: No     Allergies   Patient has no known allergies.   Review of Systems Review of Systems  Constitutional:  Positive for chills, fatigue and fever. Negative for diaphoresis.  HENT:  Positive for ear pain, sinus pressure and sore throat. Negative for  trouble swallowing.   Respiratory:  Positive for cough. Negative for shortness of breath and wheezing.   Cardiovascular:  Negative for chest pain.  Neurological:  Positive for headaches. Negative for dizziness.     Physical Exam Triage Vital Signs ED Triage Vitals  Encounter Vitals Group     BP 02/26/23 1002 131/83     Systolic BP Percentile --      Diastolic BP Percentile --      Pulse Rate 02/26/23 1002 89     Resp 02/26/23 1002 (!) 21     Temp 02/26/23 1002 98.1 F (36.7 C)     Temp Source 02/26/23 1002 Oral     SpO2 02/26/23 1002 97 %     Weight --      Height --      Head Circumference --      Peak Flow --      Pain Score 02/26/23 1003 9     Pain Loc --       Pain Education --      Exclude from Growth Chart --    No data found.  Updated Vital Signs BP 131/83 (BP Location: Right Arm)   Pulse 89   Temp 98.1 F (36.7 C) (Oral)   Resp (!) 21   SpO2 97%   Visual Acuity Right Eye Distance:   Left Eye Distance:   Bilateral Distance:    Right Eye Near:   Left Eye Near:    Bilateral Near:     Physical Exam Vitals and nursing note reviewed.  Constitutional:      Appearance: She is not ill-appearing.  HENT:     Head: Normocephalic and atraumatic.  Eyes:     Extraocular Movements: Extraocular movements intact.  Cardiovascular:     Rate and Rhythm: Normal rate and regular rhythm.  Pulmonary:     Effort: Pulmonary effort is normal.     Breath sounds: Rhonchi (left posterior base) present.  Musculoskeletal:     Cervical back: Neck supple.  Lymphadenopathy:     Cervical: No cervical adenopathy.  Neurological:     Mental Status: She is alert.      UC Treatments / Results  Labs (all labs ordered are listed, but only abnormal results are displayed) Labs Reviewed - No data to display  EKG   Radiology No results found.  Procedures Procedures (including critical care time)  Medications Ordered in UC Medications - No data to display  Initial Impression / Assessment and Plan / UC Course  I have reviewed the triage vital signs and the nursing notes.  Pertinent labs & imaging results that were available during my care of the patient were reviewed by me and considered in my medical decision making (see chart for details).     62 year old female sick for 3 weeks getting much worse over the last few days, has productive cough, body aches, fever, fatigue.  She has mild rhonchi left posterior base will treat for community-acquired pneumonia.  Strict follow-up and ED precautions reviewed with patient.  Final Clinical Impressions(s) / UC Diagnoses   Final diagnoses:  Bronchopneumonia     Discharge Instructions       Follow-up with your primary care doctor in 1 week.  Go to the emergency department for significant worsening of symptoms or concerns specifically for high fever, shaking chills, chest pain, shortness of breath   ED Prescriptions     Medication Sig Dispense Auth. Provider   amoxicillin (AMOXIL) 875 MG tablet Take  1 tablet (875 mg total) by mouth 2 (two) times daily for 7 days. 14 tablet Meliton Rattan, PA   azithromycin (ZITHROMAX) 250 MG tablet Take 1 tablet (250 mg total) by mouth daily. Take first 2 tablets together, then 1 every day until finished. 6 tablet Meliton Rattan, [place]   promethazine-dextromethorphan (PROMETHAZINE-DM) 6.25-15 MG/5ML syrup Take 5 mLs by mouth 4 (four) times daily as needed for up to 6 days for cough. 118 mL Meliton Rattan, [place]      PDMP not reviewed this encounter.   Meliton Rattan, [place] 02/26/23 1044

## 2023-02-26 NOTE — Discharge Instructions (Signed)
Follow-up with your primary care doctor in 1 week.  Go to the emergency department for significant worsening of symptoms or concerns specifically for high fever, shaking chills, chest pain, shortness of breath

## 2023-02-26 NOTE — ED Triage Notes (Signed)
Cough, sore throat, headache, body aches x 2-3 weeks with extreme worsening 3 days ago. Patient has been taking Mucinex, ibuprofen, alka seltzer cough and cold.

## 2023-05-09 DIAGNOSIS — J069 Acute upper respiratory infection, unspecified: Secondary | ICD-10-CM | POA: Diagnosis not present

## 2023-05-09 DIAGNOSIS — J029 Acute pharyngitis, unspecified: Secondary | ICD-10-CM | POA: Diagnosis not present

## 2023-05-23 DIAGNOSIS — F419 Anxiety disorder, unspecified: Secondary | ICD-10-CM | POA: Diagnosis not present

## 2023-05-23 DIAGNOSIS — I10 Essential (primary) hypertension: Secondary | ICD-10-CM | POA: Diagnosis not present

## 2023-08-25 DIAGNOSIS — M5459 Other low back pain: Secondary | ICD-10-CM | POA: Diagnosis not present

## 2023-08-26 DIAGNOSIS — M5459 Other low back pain: Secondary | ICD-10-CM | POA: Diagnosis not present

## 2023-08-26 DIAGNOSIS — M545 Low back pain, unspecified: Secondary | ICD-10-CM | POA: Diagnosis not present

## 2023-08-28 DIAGNOSIS — M5416 Radiculopathy, lumbar region: Secondary | ICD-10-CM | POA: Diagnosis not present

## 2023-08-28 DIAGNOSIS — M48061 Spinal stenosis, lumbar region without neurogenic claudication: Secondary | ICD-10-CM | POA: Diagnosis not present

## 2023-09-08 DIAGNOSIS — M5416 Radiculopathy, lumbar region: Secondary | ICD-10-CM | POA: Diagnosis not present

## 2023-09-18 DIAGNOSIS — M5416 Radiculopathy, lumbar region: Secondary | ICD-10-CM | POA: Diagnosis not present

## 2023-10-24 DIAGNOSIS — I10 Essential (primary) hypertension: Secondary | ICD-10-CM | POA: Diagnosis not present

## 2023-10-24 DIAGNOSIS — F419 Anxiety disorder, unspecified: Secondary | ICD-10-CM | POA: Diagnosis not present
